# Patient Record
Sex: Male | Born: 1990 | Race: White | Hispanic: No | Marital: Married | Smoking: Current every day smoker
Health system: Southern US, Community
[De-identification: ages and names within clinical notes are randomized; demographics above are authoritative.]

## PROBLEM LIST (undated history)

## (undated) HISTORY — PX: HERNIA REPAIR: SHX51

---

## 2014-11-09 ENCOUNTER — Emergency Department (HOSPITAL_COMMUNITY)
Admission: EM | Admit: 2014-11-09 | Discharge: 2014-11-09 | Disposition: A | Discharge status: Home / Self Care | Payer: 59 | Source: Home / Self Care | Attending: Family Medicine | Admitting: Family Medicine

## 2014-11-09 ENCOUNTER — Encounter (HOSPITAL_COMMUNITY): Payer: Self-pay | Admitting: *Deleted

## 2014-11-09 ENCOUNTER — Emergency Department (INDEPENDENT_AMBULATORY_CARE_PROVIDER_SITE_OTHER): Payer: 59

## 2014-11-09 DIAGNOSIS — S20229A Contusion of unspecified back wall of thorax, initial encounter: Secondary | ICD-10-CM | POA: Diagnosis not present

## 2014-11-09 DIAGNOSIS — W19XXXA Unspecified fall, initial encounter: Secondary | ICD-10-CM | POA: Diagnosis not present

## 2014-11-09 NOTE — ED Provider Notes (Signed)
CSN: [id number]     Arrival date & time 11/09/14  1434 History   First MD Initiated Contact with Patient 11/09/14 1614     Chief Complaint  Patient presents with  . Fall   (Consider location/radiation/quality/duration/timing/severity/associated sxs/prior Treatment) HPI Comments: 24 year old male was at work 3 days ago when he was squatting to catch it can and he briskly arose turnaround and started walking and ran into a pole. States that he struck his head and headache spot to the head. He immediately fell to the floor on his back. He said he was transiently dizzy and walked back to another room, felt little better then went back to work although he did leave early that day. He states all of his symptoms have abated except for pain in the mid lumbar spine. No headache, no loss of consciousness no more dizziness. Denies focal weakness. States his right leg feels pressure in the thigh.   History reviewed. No pertinent past medical history. History reviewed. No pertinent past surgical history. History reviewed. No pertinent family history. History  Substance Use Topics  . Smoking status: Not on file  . Smokeless tobacco: Not on file  . Alcohol Use: No    Review of Systems  Constitutional: Positive for activity change. Negative for fever.  HENT: Negative.   Respiratory: Negative.   Gastrointestinal: Negative.   Genitourinary: Negative.   Musculoskeletal: Negative.   Skin: Negative.   Neurological: Positive for dizziness. Negative for tremors, seizures, syncope, speech difficulty, weakness and headaches.  Psychiatric/Behavioral: Negative.     Allergies  Review of patient's allergies indicates no known allergies.  Home Medications   Prior to Admission medications   Not on File   BP 116/78 mmHg  Pulse 103  Temp(Src) 98.9 F (37.2 C) (Oral)  Resp 16  SpO2 99% Physical Exam  Constitutional: He is oriented to person, place, and time. He appears well-developed and  well-nourished. No distress.  Eyes: Conjunctivae and EOM are normal. Pupils are equal, round, and reactive to light.  Neck: Normal range of motion. Neck supple.  Cardiovascular: Normal rate, regular rhythm and normal heart sounds.   Pulmonary/Chest: Effort normal. No respiratory distress.  Musculoskeletal:  Tenderness to the lumbar spine and right paralumbar musculature. No visible or palpable deformity. No step-off deformity. No swelling or discoloration. Muscle strength in the lower extremities are 5 over 5 symmetric. Ambulation smooth and balanced gait. Patient is able to stand and perform a full squat and then raised to a standing position without assistance.  Lymphadenopathy:    He has no cervical adenopathy.  Neurological: He is alert and oriented to person, place, and time. He exhibits normal muscle tone.  Skin: Skin is warm and dry.  Psychiatric: He has a normal mood and affect.  Nursing note and vitals reviewed.   ED Course  Procedures (including critical care time) Labs Review Labs Reviewed - No data to display  Imaging Review Dg Lumbar Spine Complete  11/09/2014   CLINICAL DATA:  Pain following fall 4 days prior  EXAM: LUMBAR SPINE - COMPLETE 4+ VIEW  COMPARISON:  None.  FINDINGS: Frontal, lateral, spot lumbosacral lateral, and bilateral oblique views were obtained. There are 5 non-rib-bearing lumbar type vertebral bodies. There is no fracture or spondylolisthesis. Disc spaces appear intact. There is no appreciable facet arthropathy.  IMPRESSION: No fracture or spondylolisthesis.  No appreciable arthropathy.   Electronically Signed   By: Bretta Bang III M.D.   On: 11/09/2014 16:51     MDM  1. Back contusion, unspecified laterality, initial encounter   2. Fall, initial encounter    Cold pack to back Slow stretching movments. No heavy lifting for a couple of days. Ibuuprofen prn    Hayden Rasmussen, NP 11/09/14 1706

## 2014-11-09 NOTE — Discharge Instructions (Signed)
Contusion °A contusion is a deep bruise. Contusions happen when an injury causes bleeding under the skin. Signs of bruising include pain, puffiness (swelling), and discolored skin. The contusion may turn blue, purple, or yellow. °HOME CARE  °· Put ice on the injured area. °¨ Put ice in a plastic bag. °¨ Place a towel between your skin and the bag. °¨ Leave the ice on for 15-20 minutes, 03-04 times a day. °· Only take medicine as told by your doctor. °· Rest the injured area. °· If possible, raise (elevate) the injured area to lessen puffiness. °GET HELP RIGHT AWAY IF:  °· You have more bruising or puffiness. °· You have pain that is getting worse. °· Your puffiness or pain is not helped by medicine. °MAKE SURE YOU:  °· Understand these instructions. °· Will watch your condition. °· Will get help right away if you are not doing well or get worse. °Document Released: 10/29/2007 Document Revised: 08/04/2011 Document Reviewed: 03/17/2011 °[employer]® Patient Information ©2015 [employer]. This information is not intended to replace advice given to you by your health care provider. Make sure you discuss any questions you have with your health care provider. ° °

## 2014-11-09 NOTE — ED Notes (Signed)
Pt  Reports  Symptoms  Of  Low  Back    Pain -  Pt  States  He  Fell       4  Days  Ago  He  Struck  A  Pole   With  His  Face  And felled   Injuring    His  Back

## 2015-02-13 ENCOUNTER — Emergency Department (HOSPITAL_COMMUNITY)
Admission: EM | Admit: 2015-02-13 | Discharge: 2015-02-13 | Disposition: A | Discharge status: Home / Self Care | Payer: 59 | Source: Home / Self Care | Attending: Emergency Medicine | Admitting: Emergency Medicine

## 2015-02-13 ENCOUNTER — Encounter (HOSPITAL_COMMUNITY): Payer: Self-pay | Admitting: Emergency Medicine

## 2015-02-13 DIAGNOSIS — S29011A Strain of muscle and tendon of front wall of thorax, initial encounter: Secondary | ICD-10-CM | POA: Diagnosis not present

## 2015-02-13 MED ORDER — IBUPROFEN 800 MG PO TABS: 800.0000 mg | ORAL_TABLET | Freq: Three times a day (TID) | ORAL | Status: DC | PRN

## 2015-02-13 NOTE — Discharge Instructions (Signed)
You have strained a muscle in your rib cage. Alternate ice and heat to the area. Take ibuprofen 800 mg 3 times a day for the next 3 days, then as needed. This should gradually improve over the next several days. Please come back if things change or worsen.

## 2015-02-13 NOTE — ED Provider Notes (Signed)
CSN: [id number]     Arrival date & time 02/13/15  1745 History   First MD Initiated Contact with Patient 02/13/15 1907     Chief Complaint  Patient presents with  . Chest Pain   (Consider location/radiation/quality/duration/timing/severity/associated sxs/prior Treatment) HPI He is a 24 year old man here for evaluation of chest pain. This started this afternoon at 2:00. He reports episodes of sharp pain in the right anterior chest wall. They last for a few minutes and resolve spontaneously.  They are associated with mild shortness of breath, mild diaphoresis, and some mild confusion. It seems to be triggered by certain movements as well as coughing or sneezing. It is not worse with deep breaths. No recent cold or vomiting. 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  He tried taking some ibuprofen which did seem to help.  No medical problems. No family history of heart disease. He is a current smoker.  No past medical history on file. No past surgical history on file. No family history on file. Social History  Substance Use Topics  . Smoking status: Not on file  . Smokeless tobacco: Not on file  . Alcohol Use: No    Review of Systems As in history of present illness Allergies  Review of patient's allergies indicates no known allergies.  Home Medications   Prior to Admission medications   Medication Sig Start Date End Date Taking? Authorizing Provider  ibuprofen (ADVIL,MOTRIN) 800 MG tablet Take 1 tablet (800 mg total) by mouth every 8 (eight) hours as needed for moderate pain. 02/13/15   Charm Rings, MD   Meds Ordered and Administered this Visit  Medications - No data to display  BP 109/65 mmHg  Pulse 60  Temp(Src) 97.8 F (36.6 C) (Oral)  Resp 18  SpO2 100% No data found.   Physical Exam  Constitutional: He appears well-developed and well-nourished. No distress.  Cardiovascular: Normal rate, regular rhythm and normal heart sounds.     No murmur heard. Pulmonary/Chest: Effort normal and breath sounds normal. No respiratory distress. He has no wheezes. He has no rales. He exhibits tenderness.      ED Course  Procedures (including critical care time)  Labs Review Labs Reviewed - No data to display  Imaging Review No results found.   MDM   1. Chest wall muscle strain, initial encounter    Treat with alternating ice and heat to the affected area. Ibuprofen 800 mg 3 times a day for the next several days. Follow-up as needed.    Charm Rings, MD 02/13/15 [phone]

## 2016-03-30 ENCOUNTER — Encounter (HOSPITAL_COMMUNITY): Payer: Self-pay

## 2016-03-30 ENCOUNTER — Ambulatory Visit (HOSPITAL_COMMUNITY)
Admission: EM | Admit: 2016-03-30 | Discharge: 2016-03-30 | Disposition: A | Discharge status: Home / Self Care | Payer: Self-pay | Attending: Emergency Medicine | Admitting: Emergency Medicine

## 2016-03-30 DIAGNOSIS — S76301A Unspecified injury of muscle, fascia and tendon of the posterior muscle group at thigh level, right thigh, initial encounter: Secondary | ICD-10-CM

## 2016-03-30 MED ORDER — TRAMADOL HCL 50 MG PO TABS: 50.0000 mg | ORAL_TABLET | Freq: Four times a day (QID) | ORAL | 0 refills | Status: AC | PRN

## 2016-03-30 MED ORDER — IBUPROFEN 600 MG PO TABS: 600.0000 mg | ORAL_TABLET | Freq: Four times a day (QID) | ORAL | 0 refills | Status: AC | PRN

## 2016-03-30 NOTE — Discharge Instructions (Signed)
I suspect you had a small tear in your hamstring muscle and now have a hematoma. Try applying heat several times a day. Take ibuprofen every 6 hours as needed for pain. Use the tramadol every 8 hours as needed for severe pain. This medicine will make you drowsy. Follow-up if no improvement in the next week.

## 2016-03-30 NOTE — ED Provider Notes (Signed)
[facility]    CSN: [id number] Arrival date & time: 03/30/16  1802     History   Chief Complaint Chief Complaint  Patient presents with  . Leg Pain    HPI John Davies is a 25 y.o. male.   HPI  He is a 25 year old man here for evaluation of right hamstring pain. He states he works for [employer] and does a lot of bending and squatting. He denies any specific injury, but reports pain in the right hamstring for 3 days.  Pain is worse with palpation and pressure. There is some discomfort with weightbearing, but he is able to walk. He states it is a little swollen there. No redness. He started taking ibuprofen last night with some improvement.  History reviewed. No pertinent past medical history.  There are no active problems to display for this patient.   Past Surgical History:  Procedure Laterality Date  . HERNIA REPAIR         Home Medications    Prior to Admission medications   Medication Sig Start Date End Date Taking? Authorizing Provider  ibuprofen (ADVIL,MOTRIN) 600 MG tablet Take 1 tablet (600 mg total) by mouth every 6 (six) hours as needed for moderate pain. 03/30/16   Charm RingsErin J Poppy Mcafee, MD  traMADol (ULTRAM) 50 MG tablet Take 1 tablet (50 mg total) by mouth every 6 (six) hours as needed. 03/30/16   Charm RingsErin J Leslie Langille, MD    Family History History reviewed. No pertinent family history.  Social History Social History  Substance Use Topics  . Smoking status: Current Every Day Smoker    Packs/day: 0.50    Types: Cigarettes  . Smokeless tobacco: Never Used  . Alcohol use Yes     Allergies   Patient has no known allergies.   Review of Systems Review of Systems As in history of present illness  Physical Exam Triage Vital Signs ED Triage Vitals  Enc Vitals Group     BP 03/30/16 1813 120/67     Pulse Rate 03/30/16 1813 68     Resp 03/30/16 1813 16     Temp 03/30/16 1813 98.4 F (36.9 C)     Temp Source 03/30/16 1813 Oral     SpO2 03/30/16 1813  99 %     Weight 03/30/16 1814 170 lb (77.1 kg)     Height 03/30/16 1814 5\' 10"  (1.778 m)     Head Circumference --      Peak Flow --      Pain Score --      Pain Loc --      Pain Edu? --      Excl. in GC? --    No data found.   Updated Vital Signs BP 120/67 (BP Location: Left Arm)   Pulse 68   Temp 98.4 F (36.9 C) (Oral)   Resp 16   Ht 5\' 10"  (1.778 m)   Wt 170 lb (77.1 kg)   SpO2 99%   BMI 24.39 kg/m   Visual Acuity Right Eye Distance:   Left Eye Distance:   Bilateral Distance:    Right Eye Near:   Left Eye Near:    Bilateral Near:     Physical Exam  Constitutional: He is oriented to person, place, and time. He appears well-developed and well-nourished. No distress.  Cardiovascular: Normal rate.   Pulmonary/Chest: Effort normal.  Musculoskeletal:  Right leg: No edema or erythema. He does have some mild local swelling over the muscle belly  of the hand hamstring. He is quite tender over this area. Minimal tenderness at the ischial tuberosity. Normal range of motion.  Neurological: He is alert and oriented to person, place, and time.     UC Treatments / Results  Labs (all labs ordered are listed, but only abnormal results are displayed) Labs Reviewed - No data to display  EKG  EKG Interpretation None       Radiology No results found.  Procedures Procedures (including critical care time)  Medications Ordered in UC Medications - No data to display   Initial Impression / Assessment and Plan / UC Course  I have reviewed the triage vital signs and the nursing notes.  Pertinent labs & imaging results that were available during my care of the patient were reviewed by me and considered in my medical decision making (see chart for details).  Clinical Course     I suspect he has a hematoma in the muscle belly of the hamstring. Recommended heat and ibuprofen. Prescription for tramadol provided to use for severe pain.  Final Clinical Impressions(s) /  UC Diagnoses   Final diagnoses:  Right hamstring injury, initial encounter    New Prescriptions Discharge Medication List as of 03/30/2016  7:07 PM    START taking these medications   Details  traMADol (ULTRAM) 50 MG tablet Take 1 tablet (50 mg total) by mouth every 6 (six) hours as needed., Starting Sun 03/30/2016, Normal         Charm RingsErin J Garner Dullea, MD 03/30/16 [id number]

## 2016-03-30 NOTE — ED Triage Notes (Signed)
Pt said he started having right calf pain for 3 days and did not injure his self and just hurts with weightbearing and has been taking ibuprofen and elevating it. Ibuprofen has helped slightly. Describes it as a tightness and stays centered in that area. No redness, bruising or anything

## 2016-10-29 IMAGING — DX DG LUMBAR SPINE COMPLETE 4+V
5 series · 5 of 5 positions shown · non-contrast
Comparison: None.

CLINICAL DATA: Pain following fall 4 days prior

EXAM:
LUMBAR SPINE - COMPLETE 4+ VIEW

[l-spine ap]
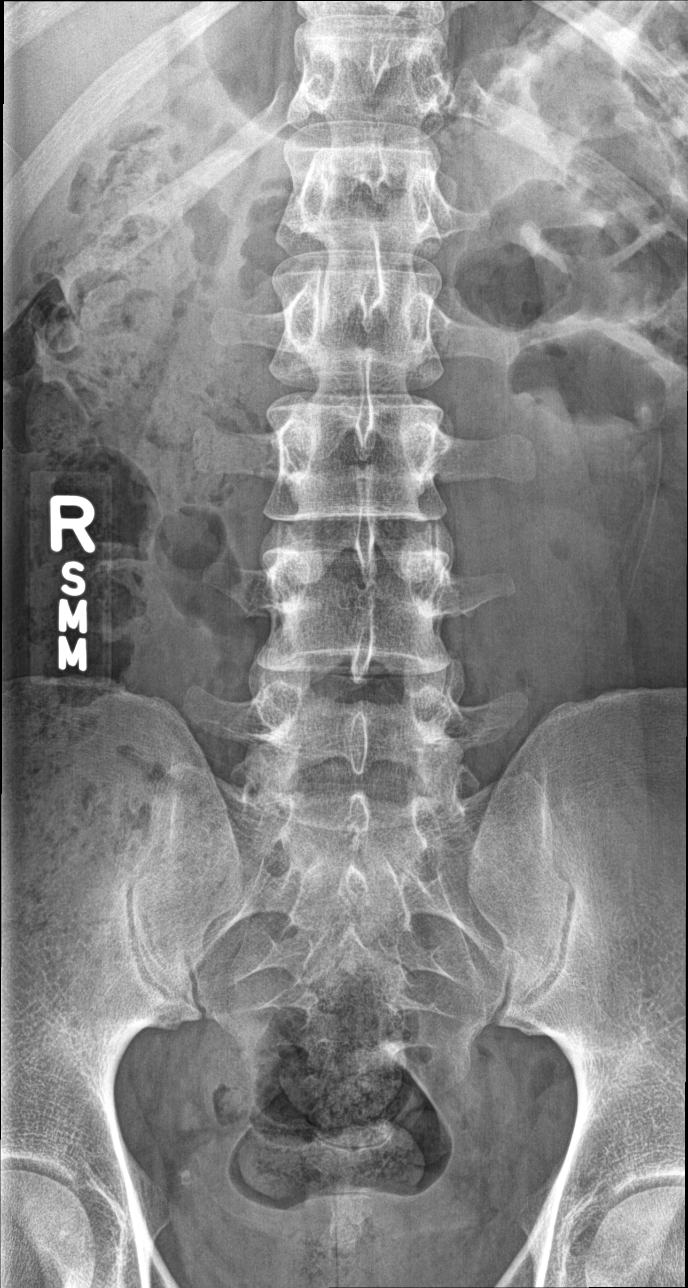

[l-spine obl (1 of 2)]
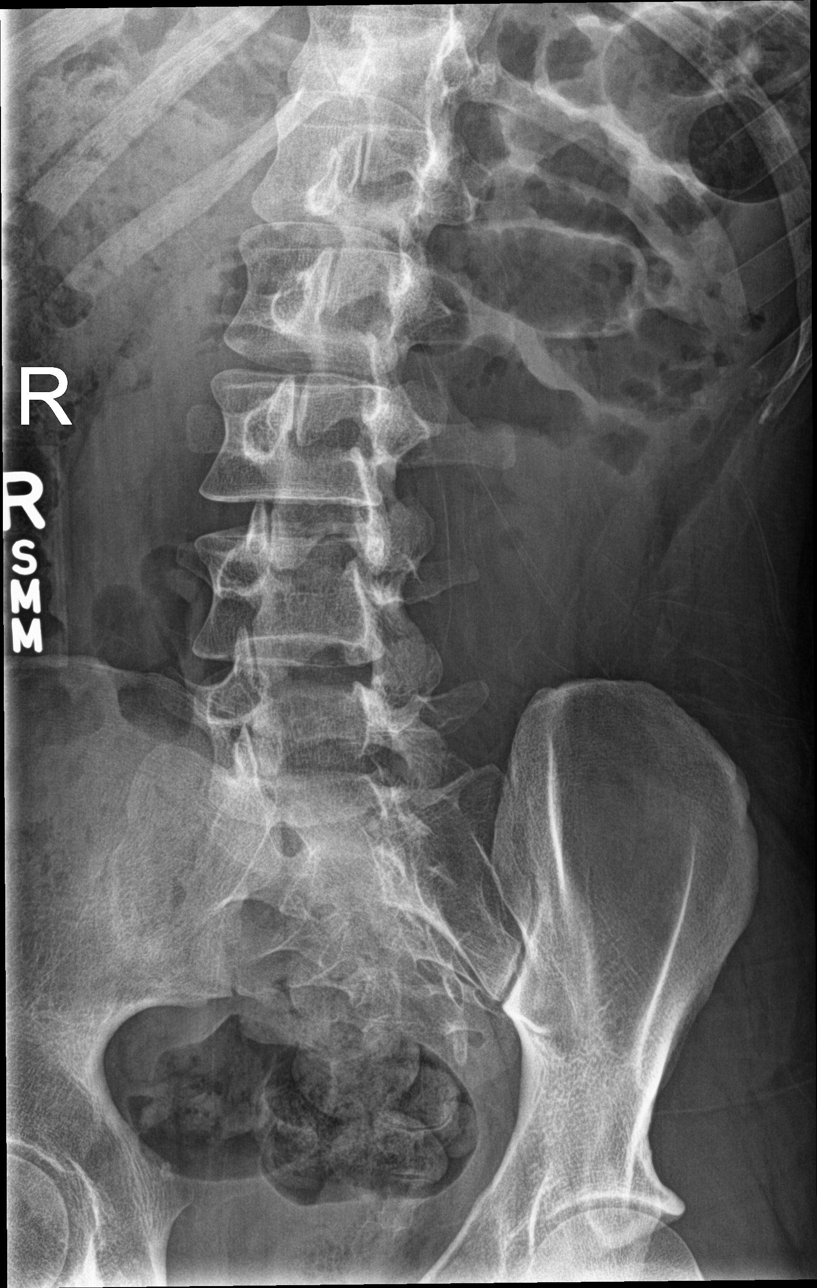

[l-spine obl (2 of 2)]
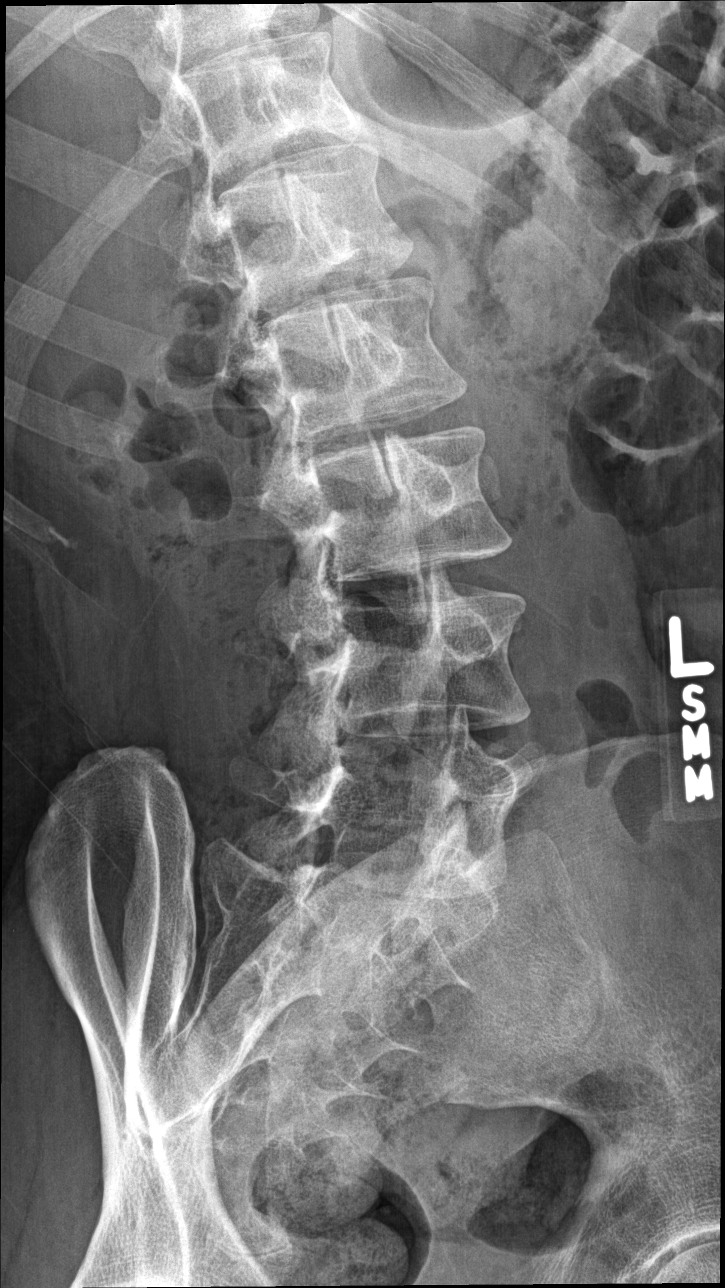

[l-spine lat]
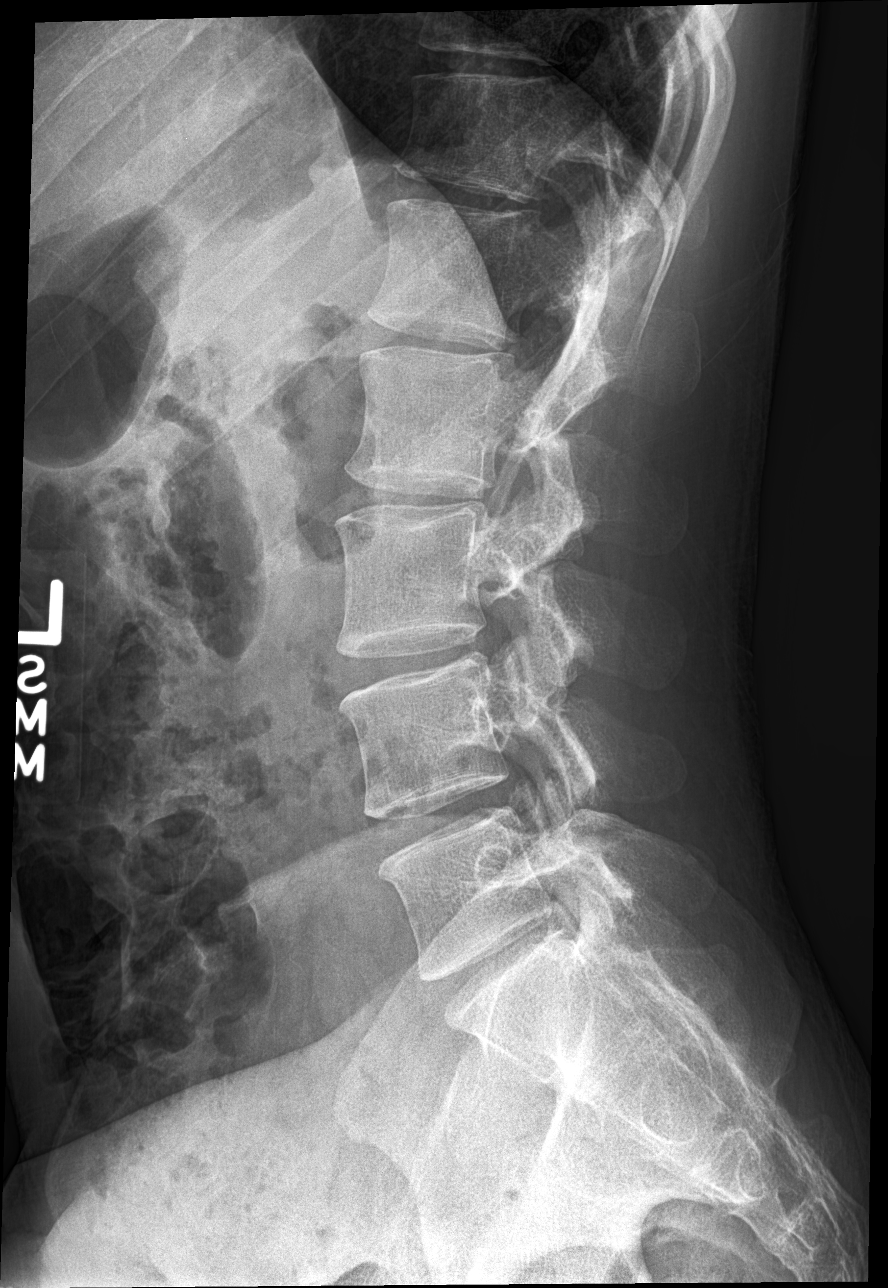

[l-spine spot]
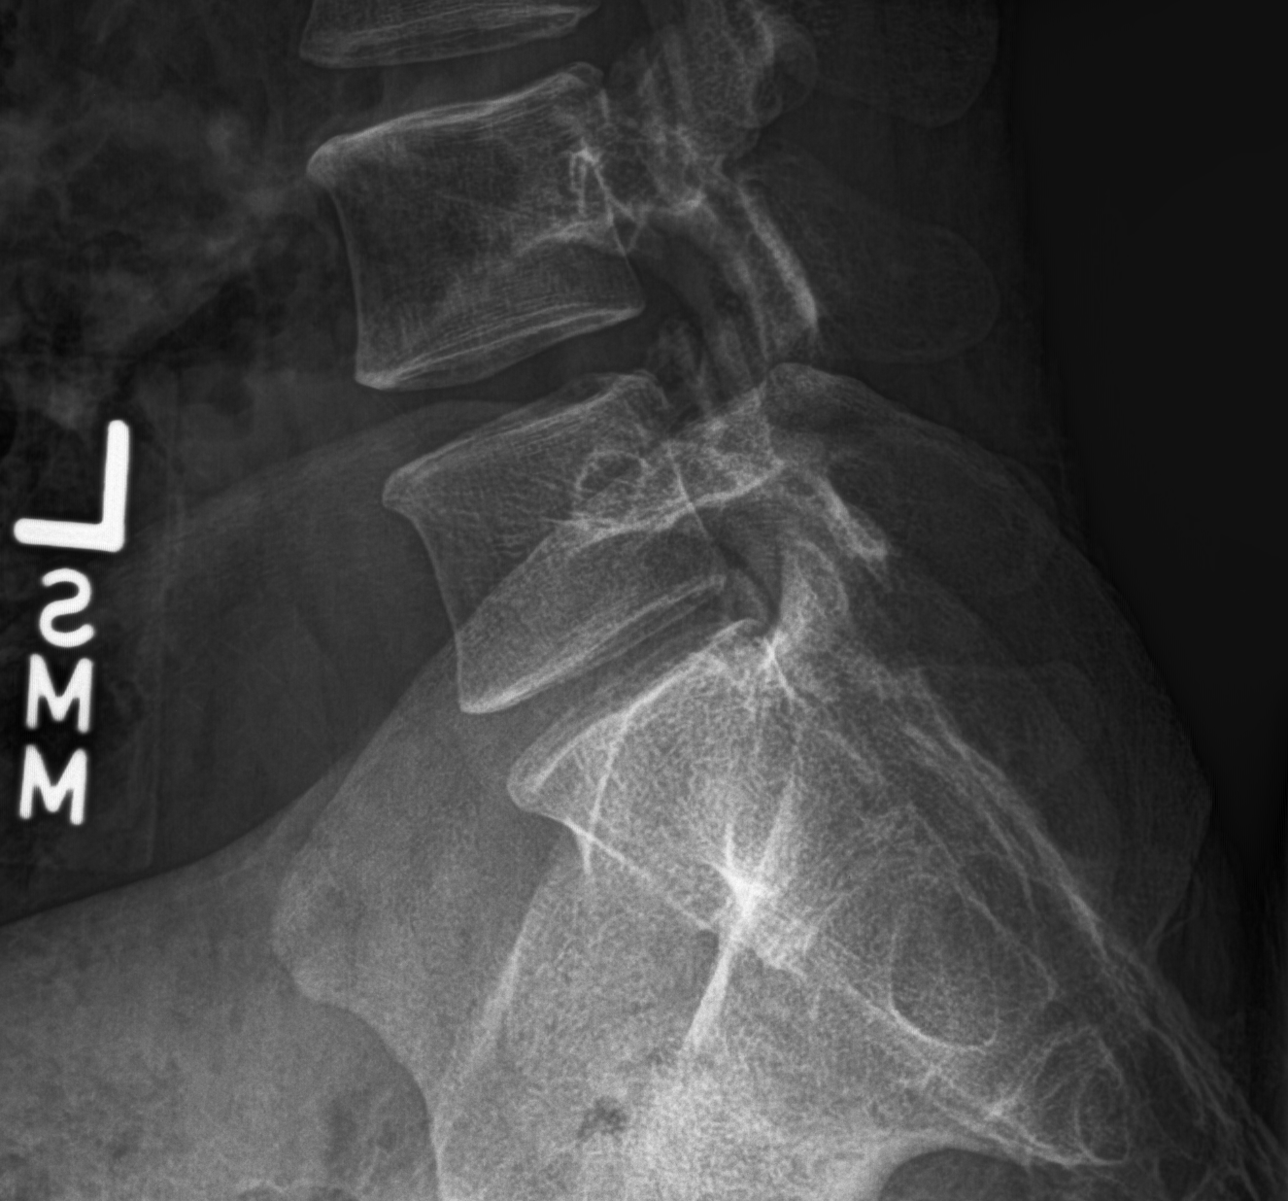

[5 of 5 positions shown; findings below may reference images not displayed]

FINDINGS: Frontal, lateral, spot lumbosacral lateral, and bilateral oblique
views were obtained. There are 5 non-rib-bearing lumbar type
vertebral bodies. There is no fracture or spondylolisthesis. Disc
spaces appear intact. There is no appreciable facet arthropathy.
IMPRESSION: No fracture or spondylolisthesis.  No appreciable arthropathy.

## 2017-11-19 ENCOUNTER — Ambulatory Visit (HOSPITAL_COMMUNITY)
Admission: EM | Admit: 2017-11-19 | Discharge: 2017-11-19 | Disposition: A | Discharge status: Home / Self Care | Payer: 59 | Attending: Family Medicine | Admitting: Family Medicine

## 2017-11-19 ENCOUNTER — Encounter (HOSPITAL_COMMUNITY): Payer: Self-pay | Admitting: Emergency Medicine

## 2017-11-19 DIAGNOSIS — H0100A Unspecified blepharitis right eye, upper and lower eyelids: Secondary | ICD-10-CM | POA: Diagnosis not present

## 2017-11-19 MED ORDER — ERYTHROMYCIN 5 MG/GM OP OINT: TOPICAL_OINTMENT | OPHTHALMIC | 1 refills | Status: AC

## 2017-11-19 NOTE — ED Provider Notes (Signed)
[facility]   [id number] 11/19/17 Arrival Time: 1454   SUBJECTIVE:  John Davies is a 27 y.o. male who presents with complaint of eye lid that began abruptly 1 week ago.  Denies a precipitating event, trauma, or personal contact with bacterial conjunctivitis.  Has tried OTC ibuprofen, tylenol, warm compresses without relief.  Symptoms are made worse with being at home.  Complains of clear/ white discharge, blurry vision, and itching.  Denies eye pain, double vision, or FB sensation.     Denies contact lens use.    ROS: As per HPI.  History reviewed. No pertinent past medical history. Past Surgical History:  Procedure Laterality Date  . HERNIA REPAIR     No Known Allergies No current facility-administered medications on file prior to encounter.    Current Outpatient Medications on File Prior to Encounter  Medication Sig Dispense Refill  . ibuprofen (ADVIL,MOTRIN) 600 MG tablet Take 1 tablet (600 mg total) by mouth every 6 (six) hours as needed for moderate pain. 30 tablet 0  . traMADol (ULTRAM) 50 MG tablet Take 1 tablet (50 mg total) by mouth every 6 (six) hours as needed. 15 tablet 0   Social History   Socioeconomic History  . Marital status: Married    Spouse name: Not on file  . Number of children: Not on file  . Years of education: Not on file  . Highest education level: Not on file  Occupational History  . Not on file  Social Needs  . Financial resource strain: Not on file  . Food insecurity:    Worry: Not on file    Inability: Not on file  . Transportation needs:    Medical: Not on file    Non-medical: Not on file  Tobacco Use  . Smoking status: Current Every Day Smoker    Packs/day: 0.50    Types: Cigarettes  . Smokeless tobacco: Never Used  Substance and Sexual Activity  . Alcohol use: Yes  . Drug use: No  . Sexual activity: Not on file  Lifestyle  . Physical activity:    Days per week: Not on file    Minutes per session: Not on file  .  Stress: Not on file  Relationships  . Social connections:    Talks on phone: Not on file    Gets together: Not on file    Attends religious service: Not on file    Active member of club or organization: Not on file    Attends meetings of clubs or organizations: Not on file    Relationship status: Not on file  . Intimate partner violence:    Fear of current or ex partner: Not on file    Emotionally abused: Not on file    Physically abused: Not on file    Forced sexual activity: Not on file  Other Topics Concern  . Not on file  Social History Narrative  . Not on file   History reviewed. No pertinent family history.  OBJECTIVE:    Visual Acuity  Right Eye Distance:   Left Eye Distance:   Bilateral Distance:    Right Eye Near: R Near: 20/25 Left Eye Near:  L Near: 20/25 Bilateral Near:  20/25   Vitals:   11/19/17 1503  BP: 121/69  Pulse: 65  Resp: 18  Temp: 98.2 F (36.8 C)  TempSrc: Oral  SpO2: 100%    General appearance: alert; no distress Eyes: eyelids/periorbital: periorbital edema on the right; and erythema Neck: supple  Lungs: clear to auscultation bilaterally Heart: regular rate and rhythm Skin: warm and dry Psychological: alert and cooperative; normal mood and affect      ASSESSMENT & PLAN:  1. Blepharitis of both upper and lower eyelid of right eye, unspecified type     Meds ordered this encounter  Medications  . erythromycin ophthalmic ointment    Sig: Place a 1/2 inch ribbon of ointment into the upper and lower eyelid.    Dispense:  3.5 g    Refill:  1    Order Specific Question:   Supervising Provider    Answer:   Isa Rankin [[id number]]    Continue with warm compresses 5-10 minutes, 2-4x daily Perform lid massage immediately following warm compress therapy.  Gently massage eyelid  Perform lid washing daily with very dilute baby shampoo or warm water Use OTC eye drops as needed for symptomatic relief Erythromycin ointment  prescribed.  Use as directed Follow up with ophthalmology if symptoms persists or worsening Go to the ER if you have any new or worsening symptoms  Reviewed expectations re: course of current medical issues. Questions answered. Outlined signs and symptoms indicating need for more acute intervention. Patient verbalized understanding. After Visit Summary given.   Rennis Harding, PA-C 11/19/17 1630

## 2017-11-19 NOTE — ED Triage Notes (Signed)
Pt with right eye swelling on upper eye lid x 1 week

## 2017-11-19 NOTE — Discharge Instructions (Addendum)
Continue with warm compresses 5-10 minutes, 2-4x daily Perform lid massage immediately following warm compress therapy.  Gently massage eyelid  Perform lid washing daily with very dilute baby shampoo or warm water Use OTC eye drops as needed for symptomatic relief Erythromycin ointment prescribed.  Use as directed Follow up with ophthalmology if symptoms persists or worsening Go to the ER if you have any new or worsening symptoms
